# Patient Record
Sex: Male | Born: 1990 | Race: White | Hispanic: No
Health system: Southern US, Community
[De-identification: ages and names within clinical notes are randomized; demographics above are authoritative.]

---
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
## 2020-01-02 ENCOUNTER — Ambulatory Visit: Payer: Self-pay | Attending: Internal Medicine

## 2020-01-02 DIAGNOSIS — Z23 Encounter for immunization: Secondary | ICD-10-CM

## 2020-01-02 NOTE — Progress Notes (Signed)
   Covid-19 Vaccination Clinic  Name:  Stephen Simmons    MRN: [id number] DOB: February 03, 1991  01/02/2020  Mr. Pantaleo was observed post Covid-19 immunization for 15 minutes without incident. He was provided with Vaccine Information Sheet and instruction to access the V-Safe system.   Mr. Benish was instructed to call 911 with any severe reactions post vaccine: Marland Kitchen Difficulty breathing  . Swelling of face and throat  . A fast heartbeat  . A bad rash all over body  . Dizziness and weakness   Immunizations Administered    Name Date Dose VIS Date Route   Pfizer COVID-19 Vaccine 01/02/2020  2:40 PM 0.3 mL 09/26/2019 Intramuscular   Manufacturer: [employer], [employer]   Lot: [id number]   NDC: [address]-9

## 2020-01-28 ENCOUNTER — Ambulatory Visit: Payer: Self-pay | Attending: Internal Medicine

## 2020-01-28 DIAGNOSIS — Z23 Encounter for immunization: Secondary | ICD-10-CM

## 2020-01-28 NOTE — Progress Notes (Signed)
   Covid-19 Vaccination Clinic  Name:  CHADDRICK BRUE    MRN: [id number] DOB: [birth year]  01/28/2020  Mr. Bontrager was observed post Covid-19 immunization for 15 minutes without incident. He was provided with Vaccine Information Sheet and instruction to access the V-Safe system.   Mr. Brunty was instructed to call 911 with any severe reactions post vaccine: Marland Kitchen Difficulty breathing  . Swelling of face and throat  . A fast heartbeat  . A bad rash all over body  . Dizziness and weakness   Immunizations Administered    Name Date Dose VIS Date Route   Pfizer COVID-19 Vaccine 01/28/2020  4:23 PM 0.3 mL 09/26/2019 Intramuscular   Manufacturer: [employer], [employer]   Lot: [id number]   NDC: [address]-9

## 2023-07-05 ENCOUNTER — Encounter: Payer: Self-pay | Admitting: Urology

## 2023-07-05 ENCOUNTER — Ambulatory Visit: Payer: Managed Care, Other (non HMO) | Admitting: Urology

## 2023-07-05 VITALS — BP 122/76 | HR 79 | Ht 69.0 in | Wt 210.0 lb

## 2023-07-05 DIAGNOSIS — Z3009 Encounter for other general counseling and advice on contraception: Secondary | ICD-10-CM | POA: Diagnosis not present

## 2023-07-05 NOTE — Patient Instructions (Signed)
Pre-Vasectomy Instructions ? ?STOP all aspirin or blood thinners (Aspirin, Plavix, Coumadin, Warfarin, Motrin, Ibuprofen, Advil, Aleve, Naproxen, Naprosyn) for 7 days prior to the procedure.  If you have any questions about stopping these medications please contact your primary care physician or cardiologist. ? ?Shave all hair from the upper scrotum on the day of the procedure.  This means just under the penis onto the scrotal sac.  The area shaved should measure about 2-3 inches around.  You may lather the scrotum with soap and water, and shave with a safety razor. ? ?After shaving the area, thoroughly wash the penis and the scrotum, then shower or bathe to remove all the loose hairs.  If needed, wash the area again just before coming in for your Vasectomy. ? ?It is recommended to have a light meal an hour or so prior to the procedure. ? ?Bring a scrotal support (jock strap or suspensory, or tight jockey shorts or underwear).  Wear comfortable pants or shorts. ? ?While the actual procedure usually takes about 45 minutes, you should be prepared to stay in the office for approximately one hour.  Bring someone with you to drive you home. ? ?If you have any questions or concerns, please feel free to call the office at [phone]. ?  ?

## 2023-07-05 NOTE — Progress Notes (Signed)
07/05/2023 9:22 AM   Stephen Simmons 08/05/1991 [id number]  Referring provider: Danella Penton, MD [phone] [facility] [address] [facility] [place],  [place] [address]  Chief Complaint  Patient presents with   VAS Consult    HPI: Stephen Simmons is a 32 y.o. male who presents for vasectomy counseling.  Married with 3 children Denies prior history urologic problems including chronic scrotal pain, epididymitis or orchitis No previous history inguinal hernia or pelvic surgery No history of bleeding or clotting disorders   PMH: History reviewed. No pertinent past medical history.  Surgical History: History reviewed. No pertinent surgical history.  Home Medications:  Allergies as of 07/05/2023       Reactions   Cefprozil Rash   Sertraline Diarrhea        Medication List    as of July 05, 2023  9:22 AM   You have not been prescribed any medications.     Allergies:  Allergies  Allergen Reactions   Cefprozil Rash   Sertraline Diarrhea    Family History: History reviewed. No pertinent family history.  Social History:  reports that he has never smoked. He has never used smokeless tobacco. He reports that he does not use drugs. No history on file for alcohol use.   Physical Exam: BP 122/76 (BP Location: Left Arm, Patient Position: Sitting, Cuff Size: Large)   Pulse 79   Ht 5\' 9"  (1.753 m)   Wt 210 lb (95.3 kg)   BMI 31.01 kg/m   Constitutional:  Alert and oriented, No acute distress. HEENT: [place] AT Respiratory: Normal respiratory effort, no increased work of breathing. GU: Phallus without lesions, testes descended bilaterally without masses or tenderness, thick spermatic cords.  Vasa palpable bilaterally Psychiatric: Normal mood and affect.   Assessment & Plan:    1.  Undesired fertility Desires to schedule vasectomy We had a long discussion about vasectomy. We specifically discussed the procedure, recovery and the risks,  benefits and alternatives of vasectomy. I explained that the procedure entails removal of a segment of each vas deferens, each of which conducts sperm, and that the purpose of this procedure is to cause sterility (inability to produce children or cause pregnancy). Vasectomy is intended to be permanent and irreversible form of contraception. Options for fertility after vasectomy include vasectomy reversal, or sperm retrieval with in vitro fertilization. These options are not always successful, and they may be expensive. We discussed reversible forms of birth control such as condoms, IUD or diaphragms, as well as the option of freezing sperm in a sperm bank prior to the vasectomy procedure. We discussed the importance of avoiding strenuous exercise for four days after vasectomy, and the importance of refraining from any form of ejaculation for seven days after vasectomy. I explained that vasectomy does not produce immediate sterility so another form of contraceptive must be used until sterility is assured by having semen checked for sperm. Thus, a post vasectomy semen analysis is necessary to confirm sterility. Rarely, vasectomy must be repeated. We discussed the approximately 1 in 2,000 risk of pregnancy after vasectomy for men who have post-vasectomy semen analysis showing absent sperm or rare non-motile sperm. Typical side effects include a small amount of oozing blood, some discomfort and mild swelling in the area of incision.  Vasectomy does not affect sexual performance, function, please, sensation, interest, desire, satisfaction, penile erection, volume of semen or ejaculation. Other rare risks include allergy or adverse reaction to an anesthetic, testicular atrophy, hematoma, infection/abscess, prolonged  tenderness of the vas deferens, pain, swelling, painful nodule or scar (called sperm granuloma) or epididymtis. We discussed chronic testicular pain syndrome. This has been reported to occur in as many as 1-2%  of men and may be permanent. This can be treated with medication, small procedures or (rarely) surgery. He indicated he would call back if he desires a preprocedure anxiolytic and would need a driver if utilizing   Riki Altes, MD  [facility] [address] [place], [place] [address] [phone]

## 2023-08-16 ENCOUNTER — Encounter: Payer: Managed Care, Other (non HMO) | Admitting: Urology

## 2023-08-31 ENCOUNTER — Encounter: Payer: Managed Care, Other (non HMO) | Admitting: Urology

## 2023-09-06 ENCOUNTER — Encounter: Payer: Managed Care, Other (non HMO) | Admitting: Urology

## 2023-10-03 ENCOUNTER — Ambulatory Visit: Payer: Managed Care, Other (non HMO) | Admitting: Urology

## 2023-10-03 ENCOUNTER — Encounter: Payer: Self-pay | Admitting: Urology

## 2023-10-03 VITALS — BP 138/82 | HR 98

## 2023-10-03 DIAGNOSIS — Z302 Encounter for sterilization: Secondary | ICD-10-CM | POA: Diagnosis not present

## 2023-10-03 MED ORDER — HYDROCODONE-ACETAMINOPHEN 5-325 MG PO TABS
1.0000 | ORAL_TABLET | ORAL | 0 refills | Status: AC | PRN
Start: 2023-10-03 — End: ?

## 2023-10-03 NOTE — Patient Instructions (Signed)

## 2023-10-03 NOTE — Progress Notes (Unsigned)
   Vasectomy Procedure Note  Indications: Stephen Simmons is a 32 y.o. male who presents today for elective sterilization.  He has been consented for the procedure.  He is aware of the risks and benefits.  He had no additional questions.  He agrees to proceed.  He denies any other significant change since his last visit.  Pre-operative Diagnosis: Elective sterilization  Post-operative Diagnosis: Elective sterilization  Premedication: Valium 10 mg po  Surgeon: Lorin Picket C. Brenin Heidelberger, M.D  Description: The patient was prepped and draped in the standard fashion.  The right vas deferens was identified and brought superiorly to the anterior scrotal skin.  The skin and vas were then anesthetized utilizing 8 ml 1% lidocaine.  A small stab incision was made and spread with the vas dissector.  The vas was grasped utilizing the vas clamp and elevated out of the incision.   The vas was grasped utilizing the vas clamp and the vas sheath was incised.  The vas was dissected out of the sheath, elevated out of the incision and dissected free from the surrounding tissue and vessels.  A 1 cm segment was excised and the vas lumens were cauterized utilizing electrocautery.  No significant bleeding was observed.  The vas ends were then dropped back into the hemiscrotum.  The skin was closed with hemostatic pressure.  An identical procedure was performed on the contralateral side.  Clean dry gauze was applied to the incision sites.  The patient tolerated the procedure well.  Complications:None  Recommendations: 1.  No lifting greater than 10 pounds or strenuous activity for 1 week. 2.  Scrotal support for 1-2 weeks. 3.  May shower in 24 hours; no bath, hot tub for 1 week 4.  No intercourse for at least 7 days and resume based on level of discomfort  5.  Continue alternate contraception for 12 weeks.  6.  Call for significant pain, swelling, redness, drainage or fever greater than 100.5. 7.  Rx hydrocodone/APAP  5/325 1-2 every 6 hours prn pain. 8.  Follow-up semen analysis in 12 weeks.   Irineo Axon, MD

## 2023-12-31 ENCOUNTER — Other Ambulatory Visit: Payer: Self-pay | Admitting: *Deleted

## 2023-12-31 DIAGNOSIS — Z302 Encounter for sterilization: Secondary | ICD-10-CM

## 2024-01-02 ENCOUNTER — Other Ambulatory Visit: Payer: Self-pay

## 2024-01-23 ENCOUNTER — Other Ambulatory Visit: Payer: Self-pay

## 2024-02-06 ENCOUNTER — Other Ambulatory Visit: Payer: Self-pay

## 2024-02-06 DIAGNOSIS — Z302 Encounter for sterilization: Secondary | ICD-10-CM

## 2024-02-08 LAB — POST-VAS SPERM EVALUATION,QUAL: Volume: 5.4 mL

## 2024-02-11 ENCOUNTER — Encounter: Payer: Self-pay | Admitting: Urology
